# Patient Record
Sex: Male | Born: 2000
Health system: Southern US, Community
[De-identification: ages and names within clinical notes are randomized; demographics above are authoritative.]

## PROBLEM LIST (undated history)

## (undated) DIAGNOSIS — S0291XA Unspecified fracture of skull, initial encounter for closed fracture: Secondary | ICD-10-CM

## (undated) DIAGNOSIS — F4010 Social phobia, unspecified: Secondary | ICD-10-CM

## (undated) DIAGNOSIS — J45909 Unspecified asthma, uncomplicated: Secondary | ICD-10-CM

## (undated) DIAGNOSIS — F1210 Cannabis abuse, uncomplicated: Secondary | ICD-10-CM

## (undated) DIAGNOSIS — G4700 Insomnia, unspecified: Secondary | ICD-10-CM

## (undated) HISTORY — PX: BACK SURGERY: SHX140

## (undated) HISTORY — DX: Unspecified asthma, uncomplicated: J45.909

## (undated) HISTORY — PX: OTHER SURGICAL HISTORY: SHX169

---
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
## 2019-04-10 ENCOUNTER — Ambulatory Visit (HOSPITAL_COMMUNITY)
Admission: EM | Admit: 2019-04-10 | Discharge: 2019-04-10 | Disposition: A | Discharge status: Home / Self Care | Payer: Medicaid Other | Attending: Emergency Medicine | Admitting: Emergency Medicine

## 2019-04-10 ENCOUNTER — Encounter (HOSPITAL_COMMUNITY): Payer: Self-pay | Admitting: *Deleted

## 2019-04-10 ENCOUNTER — Other Ambulatory Visit: Payer: Self-pay

## 2019-04-10 DIAGNOSIS — L089 Local infection of the skin and subcutaneous tissue, unspecified: Secondary | ICD-10-CM | POA: Diagnosis not present

## 2019-04-10 DIAGNOSIS — S60522A Blister (nonthermal) of left hand, initial encounter: Secondary | ICD-10-CM | POA: Diagnosis not present

## 2019-04-10 HISTORY — DX: Unspecified fracture of skull, initial encounter for closed fracture: S02.91XA

## 2019-04-10 MED ORDER — HYDROCODONE-ACETAMINOPHEN 5-325 MG PO TABS: 1.0000 | ORAL_TABLET | Freq: Four times a day (QID) | ORAL | 0 refills | Status: AC | PRN

## 2019-04-10 MED ORDER — DOXYCYCLINE HYCLATE 100 MG PO CAPS
100.0000 mg | ORAL_CAPSULE | Freq: Two times a day (BID) | ORAL | 0 refills | Status: AC
Start: 2019-04-10 — End: 2019-04-20

## 2019-04-10 NOTE — Discharge Instructions (Addendum)
Begin doxycycline twice daily for 10 days  Use anti-inflammatories for pain/swelling. You may take up to 800 mg Ibuprofen every 8 hours with food. You may supplement Ibuprofen with Tylenol [phone] every 8 hours.   Hydrcodone for severe pain  Follow up in 48 hours or sooner if worsening or not improving, developing fevers
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
## 2019-06-06 ENCOUNTER — Emergency Department (HOSPITAL_COMMUNITY)
Admission: EM | Admit: 2019-06-06 | Discharge: 2019-06-06 | Disposition: A | Discharge status: Home / Self Care | Payer: Medicaid Other | Attending: Emergency Medicine | Admitting: Emergency Medicine

## 2019-06-06 ENCOUNTER — Emergency Department (HOSPITAL_COMMUNITY): Payer: Medicaid Other

## 2019-06-06 ENCOUNTER — Other Ambulatory Visit: Payer: Self-pay

## 2019-06-06 ENCOUNTER — Encounter (HOSPITAL_COMMUNITY): Payer: Self-pay | Admitting: Emergency Medicine

## 2019-06-06 DIAGNOSIS — R51 Headache: Secondary | ICD-10-CM | POA: Diagnosis not present

## 2019-06-06 DIAGNOSIS — R112 Nausea with vomiting, unspecified: Secondary | ICD-10-CM | POA: Diagnosis not present

## 2019-06-06 DIAGNOSIS — E876 Hypokalemia: Secondary | ICD-10-CM | POA: Diagnosis not present

## 2019-06-06 DIAGNOSIS — F1290 Cannabis use, unspecified, uncomplicated: Secondary | ICD-10-CM | POA: Diagnosis not present

## 2019-06-06 DIAGNOSIS — E860 Dehydration: Secondary | ICD-10-CM | POA: Insufficient documentation

## 2019-06-06 DIAGNOSIS — R55 Syncope and collapse: Secondary | ICD-10-CM

## 2019-06-06 DIAGNOSIS — R569 Unspecified convulsions: Secondary | ICD-10-CM | POA: Insufficient documentation

## 2019-06-06 DIAGNOSIS — Z87891 Personal history of nicotine dependence: Secondary | ICD-10-CM | POA: Insufficient documentation

## 2019-06-06 LAB — RAPID URINE DRUG SCREEN, HOSP PERFORMED
Amphetamines: POSITIVE — AB
Barbiturates: NOT DETECTED
Benzodiazepines: NOT DETECTED
Cocaine: NOT DETECTED
Opiates: NOT DETECTED
Tetrahydrocannabinol: POSITIVE — AB

## 2019-06-06 LAB — COMPREHENSIVE METABOLIC PANEL
ALT: 19 U/L (ref 0–44)
AST: 24 U/L (ref 15–41)
Albumin: 4.3 g/dL (ref 3.5–5.0)
Alkaline Phosphatase: 110 U/L (ref 38–126)
Anion gap: 14 (ref 5–15)
BUN: 9 mg/dL (ref 6–20)
CO2: 23 mmol/L (ref 22–32)
Calcium: 8.5 mg/dL — ABNORMAL LOW (ref 8.9–10.3)
Chloride: 102 mmol/L (ref 98–111)
Creatinine, Ser: 0.98 mg/dL (ref 0.61–1.24)
GFR calc Af Amer: >60 mL/min (ref >60)
GFR calc non Af Amer: >60 mL/min (ref >60)
Glucose, Bld: 114 mg/dL — ABNORMAL HIGH (ref 70–99)
Potassium: 3.1 mmol/L — ABNORMAL LOW (ref 3.5–5.1)
Sodium: 139 mmol/L (ref 135–145)
Total Bilirubin: 0.5 mg/dL (ref 0.3–1.2)
Total Protein: 7.3 g/dL (ref 6.5–8.1)

## 2019-06-06 LAB — CBC WITH DIFFERENTIAL/PLATELET
Abs Immature Granulocytes: 0.06 10*3/uL (ref 0.00–0.07)
Basophils Absolute: 0 10*3/uL (ref 0.0–0.1)
Basophils Relative: 0 %
Eosinophils Absolute: 0.1 10*3/uL (ref 0.0–0.5)
Eosinophils Relative: 0 %
HCT: 44.3 % (ref 39.0–52.0)
Hemoglobin: 15.2 g/dL (ref 13.0–17.0)
Immature Granulocytes: 0 %
Lymphocytes Relative: 13 %
Lymphs Abs: 1.7 10*3/uL (ref 0.7–4.0)
MCH: 31.4 pg (ref 26.0–34.0)
MCHC: 34.3 g/dL (ref 30.0–36.0)
MCV: 91.5 fL (ref 80.0–100.0)
Monocytes Absolute: 1 10*3/uL (ref 0.1–1.0)
Monocytes Relative: 7 %
Neutro Abs: 10.7 10*3/uL — ABNORMAL HIGH (ref 1.7–7.7)
Neutrophils Relative %: 80 %
Platelets: 239 10*3/uL (ref 150–400)
RBC: 4.84 MIL/uL (ref 4.22–5.81)
RDW: 12.3 % (ref 11.5–15.5)
WBC: 13.5 10*3/uL — ABNORMAL HIGH (ref 4.0–10.5)
nRBC: 0 % (ref 0.0–0.2)

## 2019-06-06 LAB — URINALYSIS, ROUTINE W REFLEX MICROSCOPIC
Bilirubin Urine: NEGATIVE
Glucose, UA: NEGATIVE mg/dL
Hgb urine dipstick: NEGATIVE
Ketones, ur: NEGATIVE mg/dL
Leukocytes,Ua: NEGATIVE
Nitrite: NEGATIVE
Protein, ur: 30 mg/dL — AB
Specific Gravity, Urine: 1.016 (ref 1.005–1.030)
pH: 6 (ref 5.0–8.0)

## 2019-06-06 LAB — LIPASE, BLOOD: Lipase: 29 U/L (ref 11–51)

## 2019-06-06 MED ORDER — SODIUM CHLORIDE 0.9 % IV BOLUS
1000.0000 mL | Freq: Once | INTRAVENOUS | Status: AC
  Administered 2019-06-06: 07:00:00 1000 mL via INTRAVENOUS

## 2019-06-06 MED ORDER — ONDANSETRON HCL 4 MG/2ML IJ SOLN: 4.0000 mg | Freq: Once | INTRAMUSCULAR | Status: DC

## 2019-06-06 MED ORDER — POTASSIUM CHLORIDE CRYS ER 20 MEQ PO TBCR
40.0000 meq | EXTENDED_RELEASE_TABLET | Freq: Once | ORAL | Status: AC
  Administered 2019-06-06: 08:00:00 40 meq via ORAL
  Filled 2019-06-06: qty 2

## 2019-06-06 MED ORDER — POTASSIUM CHLORIDE CRYS ER 20 MEQ PO TBCR: 20.0000 meq | EXTENDED_RELEASE_TABLET | Freq: Every day | ORAL | 0 refills | Status: AC

## 2019-06-06 MED ORDER — ONDANSETRON HCL 4 MG/2ML IJ SOLN
INTRAMUSCULAR | Status: AC
  Filled 2019-06-06: qty 2

## 2019-06-06 NOTE — ED Notes (Signed)
Pt states nausea has subsided at this time.

## 2019-06-06 NOTE — ED Triage Notes (Signed)
Per EMS, pt from home, was trying to get out of bed, began to "shake" seizure like activity that lasted 3 minutes.  Family reports he fell and hit his head on hard wood floor.  When EMS arrived he was alert and oriented.  Does complain of a headache and nausea, given zofran in route.  Reports he does smoke "weed" daily and stopped taking xanax two weeks ago.

## 2019-06-06 NOTE — Discharge Instructions (Signed)
Your episode today was most likely an episode of passing out.  However we cannot rule out seizure and so you should follow-up with a neurologist.  If you develop new or severe headache, weakness or numbness in your extremities, trouble speaking, blurry vision, recurrent passing out or shaking, or any other new/concerning symptoms then return to the ER for evaluation.  Otherwise, do not drive until cleared by the neurologist.

## 2019-06-06 NOTE — ED Provider Notes (Signed)
Care transferred to me.  Lab work and CT are benign besides mild hypokalemia.  Will give potassium here and on discharge.  Otherwise, patient feels fine and would like to go.  Most likely this was syncope, but given the possibility of seizure, will refer to neurology.  No driving until cleared by them.  Results for orders placed or performed during the hospital encounter of 06/06/19  CBC with Differential/Platelet  Result Value Ref Range   WBC 13.5 (H) 4.0 - 10.5 K/uL   RBC 4.84 4.22 - 5.81 MIL/uL   Hemoglobin 15.2 13.0 - 17.0 g/dL   HCT 78.4 69.6 - 29.5 %   MCV 91.5 80.0 - 100.0 fL   MCH 31.4 26.0 - 34.0 pg   MCHC 34.3 30.0 - 36.0 g/dL   RDW 28.4 13.2 - 44.0 %   Platelets 239 150 - 400 K/uL   nRBC 0.0 0.0 - 0.2 %   Neutrophils Relative % 80 %   Neutro Abs 10.7 (H) 1.7 - 7.7 K/uL   Lymphocytes Relative 13 %   Lymphs Abs 1.7 0.7 - 4.0 K/uL   Monocytes Relative 7 %   Monocytes Absolute 1.0 0.1 - 1.0 K/uL   Eosinophils Relative 0 %   Eosinophils Absolute 0.1 0.0 - 0.5 K/uL   Basophils Relative 0 %   Basophils Absolute 0.0 0.0 - 0.1 K/uL   Immature Granulocytes 0 %   Abs Immature Granulocytes 0.06 0.00 - 0.07 K/uL  Comprehensive metabolic panel  Result Value Ref Range   Sodium 139 135 - 145 mmol/L   Potassium 3.1 (L) 3.5 - 5.1 mmol/L   Chloride 102 98 - 111 mmol/L   CO2 23 22 - 32 mmol/L   Glucose, Bld 114 (H) 70 - 99 mg/dL   BUN 9 6 - 20 mg/dL   Creatinine, Ser 1.02 0.61 - 1.24 mg/dL   Calcium 8.5 (L) 8.9 - 10.3 mg/dL   Total Protein 7.3 6.5 - 8.1 g/dL   Albumin 4.3 3.5 - 5.0 g/dL   AST 24 15 - 41 U/L   ALT 19 0 - 44 U/L   Alkaline Phosphatase 110 38 - 126 U/L   Total Bilirubin 0.5 0.3 - 1.2 mg/dL   GFR calc non Af Amer >60 >60 mL/min   GFR calc Af Amer >60 >60 mL/min   Anion gap 14 5 - 15  Lipase, blood  Result Value Ref Range   Lipase 29 11 - 51 U/L  Urinalysis, Routine w reflex microscopic  Result Value Ref Range   Color, Urine YELLOW YELLOW   APPearance HAZY (A)  CLEAR   Specific Gravity, Urine 1.016 1.005 - 1.030   pH 6.0 5.0 - 8.0   Glucose, UA NEGATIVE NEGATIVE mg/dL   Hgb urine dipstick NEGATIVE NEGATIVE   Bilirubin Urine NEGATIVE NEGATIVE   Ketones, ur NEGATIVE NEGATIVE mg/dL   Protein, ur 30 (A) NEGATIVE mg/dL   Nitrite NEGATIVE NEGATIVE   Leukocytes,Ua NEGATIVE NEGATIVE   RBC / HPF 0-5 0 - 5 RBC/hpf   WBC, UA 0-5 0 - 5 WBC/hpf   Bacteria, UA FEW (A) NONE SEEN   Squamous Epithelial / LPF 0-5 0 - 5   Mucus PRESENT    Hyaline Casts, UA PRESENT    Ca Oxalate Crys, UA PRESENT    Sperm, UA PRESENT   Rapid urine drug screen (hospital performed)  Result Value Ref Range   Opiates NONE DETECTED NONE DETECTED   Cocaine NONE DETECTED NONE DETECTED   Benzodiazepines  NONE DETECTED NONE DETECTED   Amphetamines POSITIVE (A) NONE DETECTED   Tetrahydrocannabinol POSITIVE (A) NONE DETECTED   Barbiturates NONE DETECTED NONE DETECTED   Ct Head Wo Contrast  Result Date: 06/06/2019 CLINICAL DATA:  Syncopal episode EXAM: CT HEAD WITHOUT CONTRAST TECHNIQUE: Contiguous axial images were obtained from the base of the skull through the vertex without intravenous contrast. COMPARISON:  May 16, 2017 FINDINGS: Brain: No evidence of acute infarction, hemorrhage, hydrocephalus, extra-axial collection or mass lesion/mass effect. Vascular: No hyperdense vessel or unexpected calcification. Skull: Signs of previous skull fracture on the right. No signs of acute fracture or focal lesion. Sinuses/Orbits: No acute finding. Other: None. IMPRESSION: No acute intracranial abnormality. Electronically Signed   By: Zetta Bills M.D.   On: 06/06/2019 08:13      Sherwood Gambler, MD 06/06/19 [phone]

## 2019-06-06 NOTE — ED Notes (Signed)
Patient verbalizes understanding of discharge instructions. Opportunity for questioning and answers were provided. Armband removed by staff, pt discharged from ED.   Patient's father will be transporting home.
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
## 2021-02-26 IMAGING — CT CT HEAD W/O CM
4 series · 15 of 47 positions shown, 17 images · non-contrast
Comparison: May 16, 2017

CLINICAL DATA: Syncopal episode

EXAM:
CT HEAD WITHOUT CONTRAST
TECHNIQUE: Contiguous axial images were obtained from the base of the skull
through the vertex without intravenous contrast.

[Series 3: head without · axial · non-contrast · 0.39mm/px · z∈[-56,+64]mm · 7 of 33 slices shown, 9 images]
[im 5/33  brain]
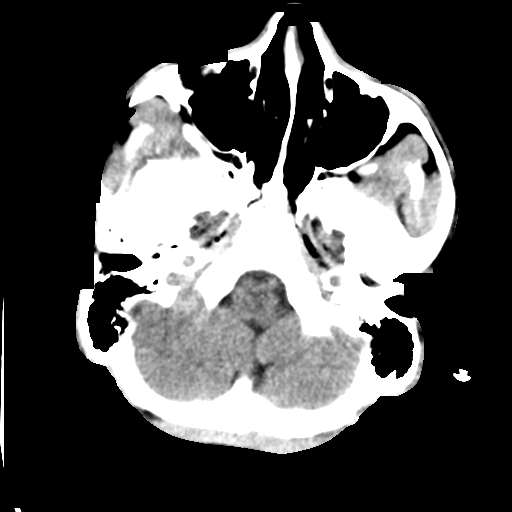
[im 5/33  bone]
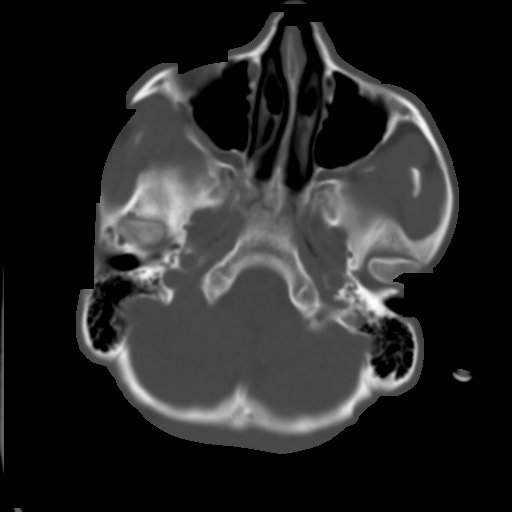
[im 9/33  brain]
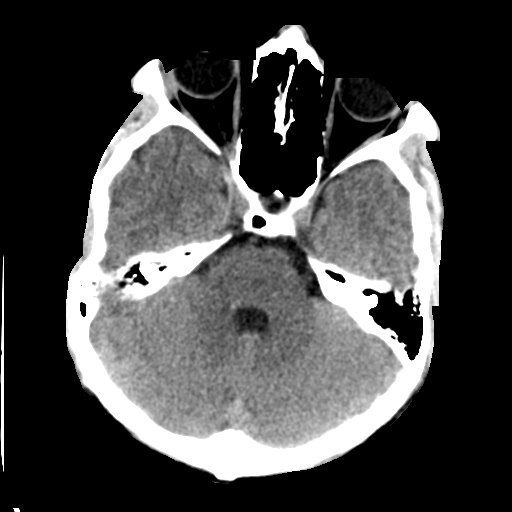
[im 13/33  brain]
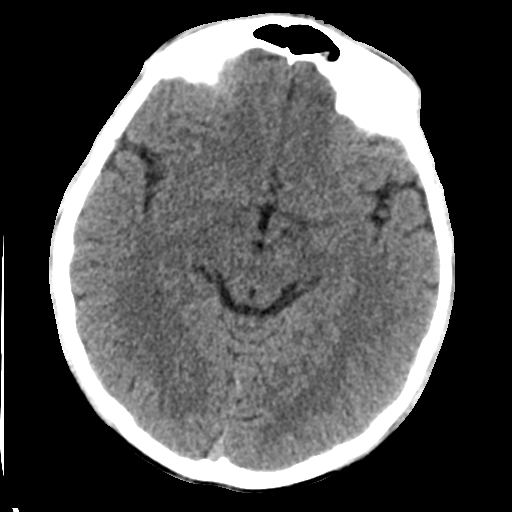
[im 17/33  brain]
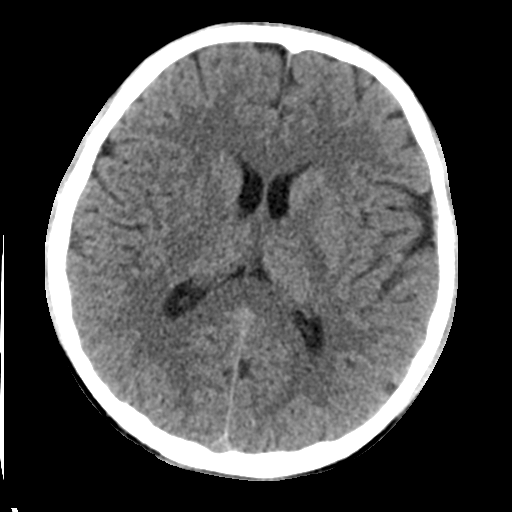
[im 21/33  brain]
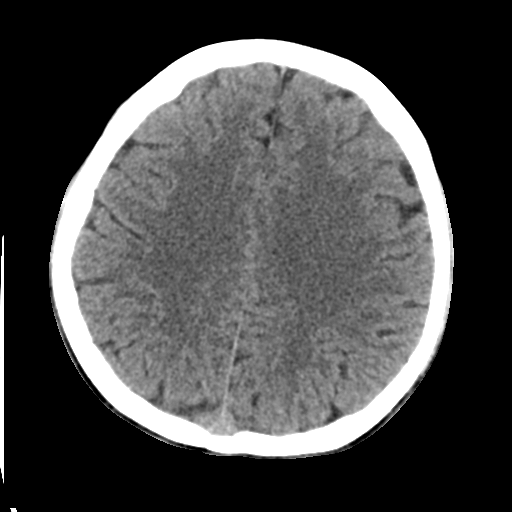
[im 21/33  bone]
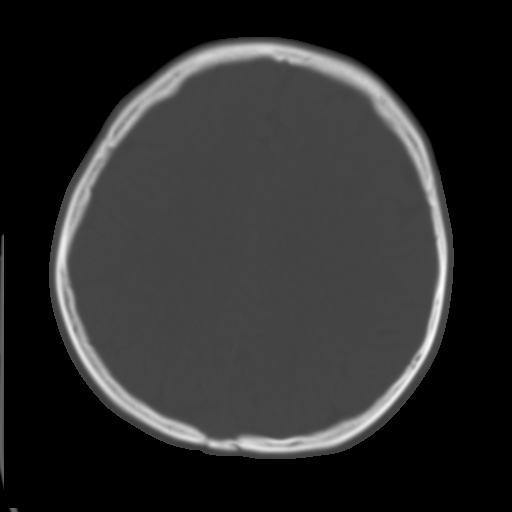
[im 25/33  brain]
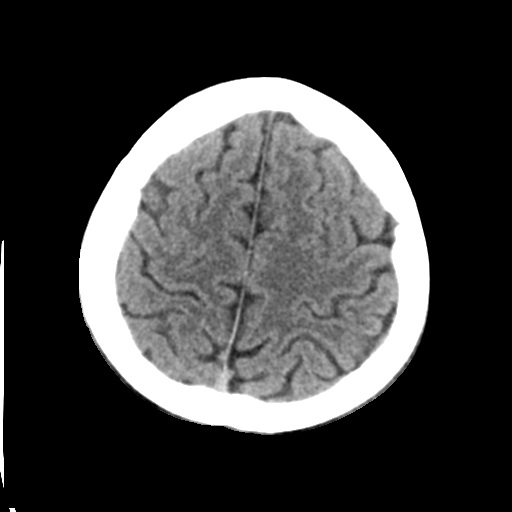
[im 29/33  brain]
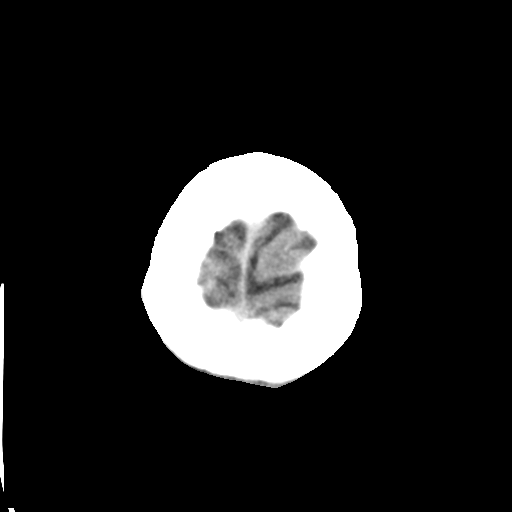

[Series 4: head bone · axial · 0.39mm/px · z∈[-60,-44]mm · 2 of 81 slices shown]
[im 9/81  bone]
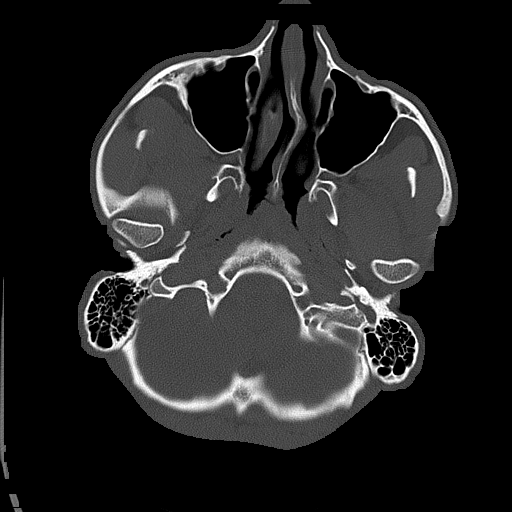
[im 17/81  bone]
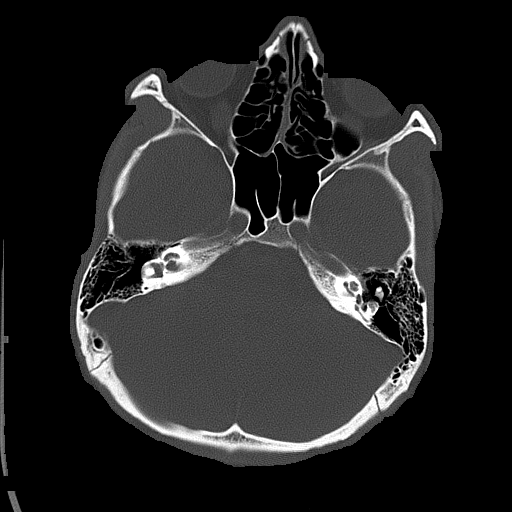

[Series 5: head without cor · coronal · non-contrast · 0.31mm/px · 3 of 70 slices shown]
[im 24/70  brain]
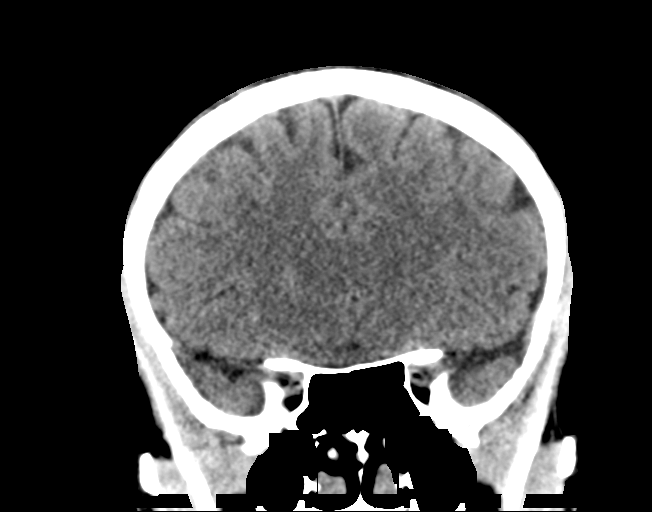
[im 31/70  brain]
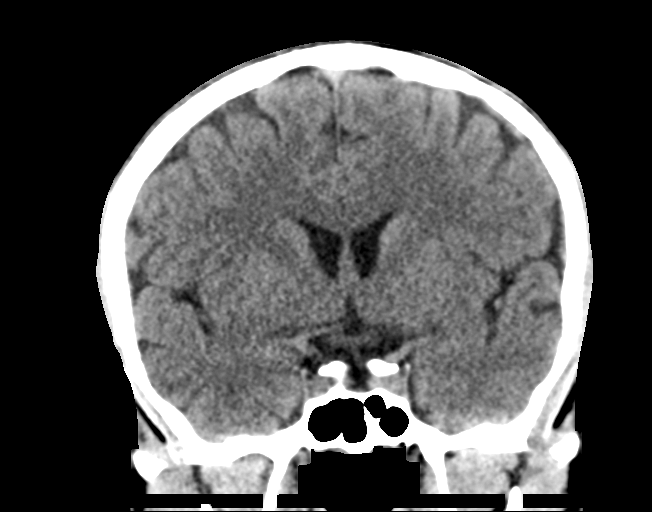
[im 39/70  brain]
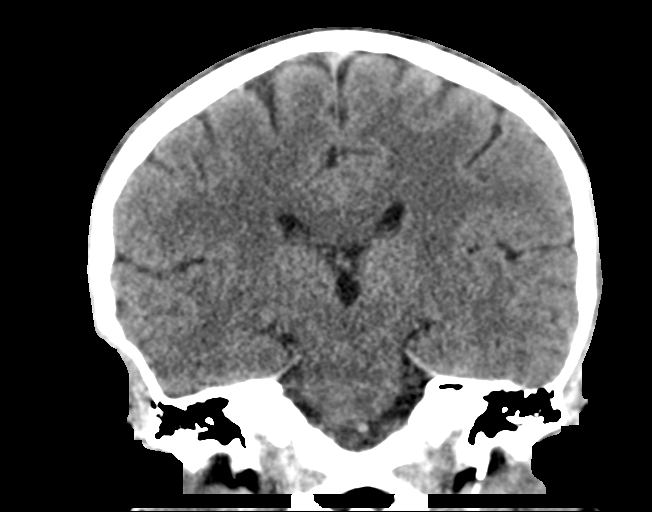

[Series 6: head without sag · sagittal · non-contrast · 0.31mm/px · 3 of 66 slices shown]
[im 22/66  brain]
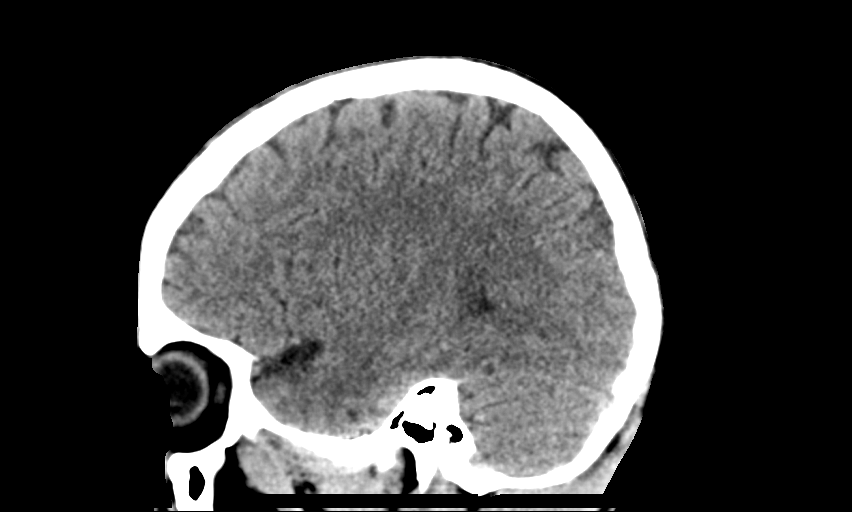
[im 33/66  brain]
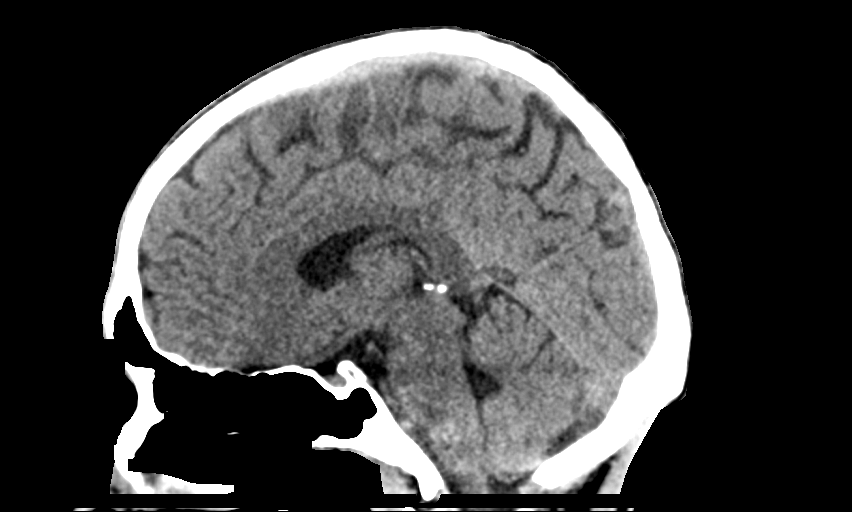
[im 44/66  brain]
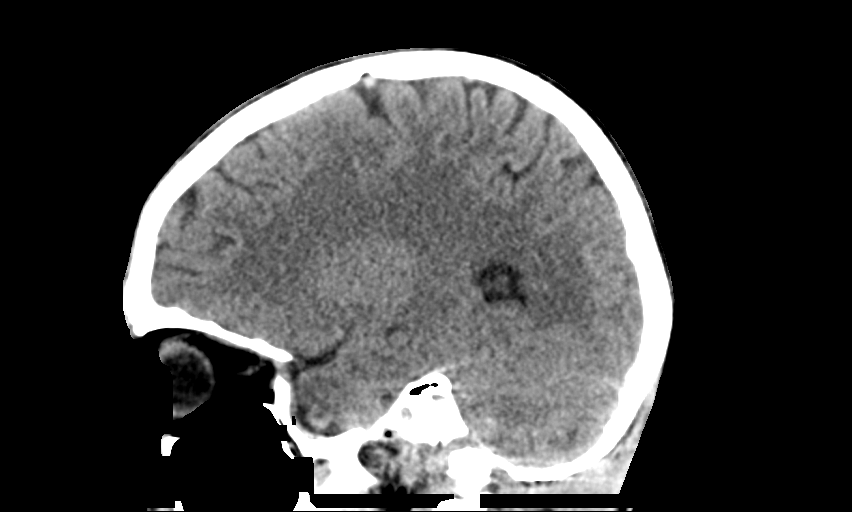

[15 of 47 positions shown; findings below may reference images not displayed]

FINDINGS: Brain: No evidence of acute infarction, hemorrhage, hydrocephalus,
extra-axial collection or mass lesion/mass effect.

Vascular: No hyperdense vessel or unexpected calcification.

Skull: Signs of previous skull fracture on the right. No signs of
acute fracture or focal lesion.

Sinuses/Orbits: No acute finding.

Other: None.
IMPRESSION: No acute intracranial abnormality.

## 2024-07-31 ENCOUNTER — Encounter (HOSPITAL_COMMUNITY): Payer: Self-pay

## 2024-07-31 ENCOUNTER — Other Ambulatory Visit: Payer: Self-pay

## 2024-07-31 ENCOUNTER — Emergency Department (HOSPITAL_COMMUNITY)
Admission: EM | Admit: 2024-07-31 | Discharge: 2024-08-01 | Disposition: A | Discharge status: Home / Self Care | Payer: Worker's Compensation | Attending: Emergency Medicine | Admitting: Emergency Medicine

## 2024-07-31 DIAGNOSIS — S61211A Laceration without foreign body of left index finger without damage to nail, initial encounter: Secondary | ICD-10-CM | POA: Diagnosis present

## 2024-07-31 DIAGNOSIS — W268XXA Contact with other sharp object(s), not elsewhere classified, initial encounter: Secondary | ICD-10-CM | POA: Insufficient documentation

## 2024-07-31 DIAGNOSIS — Z23 Encounter for immunization: Secondary | ICD-10-CM | POA: Diagnosis not present

## 2024-07-31 DIAGNOSIS — J45909 Unspecified asthma, uncomplicated: Secondary | ICD-10-CM | POA: Diagnosis not present

## 2024-07-31 MED ORDER — TETANUS-DIPHTH-ACELL PERTUSSIS 5-2-15.5 LF-MCG/0.5 IM SUSP
0.5000 mL | Freq: Once | INTRAMUSCULAR | Status: AC
  Administered 2024-08-01: 0.5 mL via INTRAMUSCULAR
  Filled 2024-07-31: qty 0.5

## 2024-07-31 MED ORDER — LIDOCAINE HCL (PF) 1 % IJ SOLN
5.0000 mL | Freq: Once | INTRAMUSCULAR | Status: AC
  Administered 2024-08-01: 5 mL
  Filled 2024-07-31: qty 30

## 2024-07-31 NOTE — ED Provider Notes (Signed)
 [place] EMERGENCY DEPARTMENT AT [facility] Provider Note   CSN: [id number] Arrival date & time: 07/31/24  2312     Patient presents with: Laceration   Joshua Proctor is a 23 y.o. male.  {Add pertinent medical, surgical, social history, OB history to HPI:744} 23 year old male presents to the emergency department for evaluation of laceration to the left index finger.  States that he was cleaning a cooler when he cut his finger on a piece of metal.  Bleeding controlled with pressure.  Not on chronic anticoagulation.  Cannot recall the date of his last tetanus shot.   Laceration      Prior to Admission medications   Medication Sig Start Date End Date Taking? Authorizing Provider  HYDROcodone -acetaminophen  (NORCO/VICODIN) 5-325 MG tablet Take 1 tablet by mouth every 6 (six) hours as needed for severe pain. 04/10/19   Wieters, Hallie C, PA-C  potassium chloride  SA (K-DUR) 20 MEQ tablet Take 1 tablet (20 mEq total) by mouth daily. 06/06/19   Freddi Hamilton, MD    Allergies: Penicillins    Review of Systems Ten systems reviewed and are negative for acute change, except as noted in the HPI.    Updated Vital Signs BP 115/75   Pulse 80   Temp 98.4 F (36.9 C) (Oral)   Resp 15   SpO2 98%   Physical Exam Vitals and nursing note reviewed.  Constitutional:      General: He is not in acute distress.    Appearance: He is well-developed. He is not diaphoretic.     Comments: Nontoxic-appearing  HENT:     Head: Normocephalic and atraumatic.  Eyes:     General: No scleral icterus.    Conjunctiva/sclera: Conjunctivae normal.  Cardiovascular:     Rate and Rhythm: Normal rate and regular rhythm.     Pulses: Normal pulses.     Comments: Capillary refill brisk in all digits of the left hand. Pulmonary:     Effort: Pulmonary effort is normal. No respiratory distress.  Musculoskeletal:        General: Normal range of motion.       Hands:     Cervical back: Normal  range of motion.     Comments: Curved laceration to the dorsal aspect of the left index finger.  No palpable, pulsatile bleeding.  No visualized or palpated foreign bodies.  Preserved range of motion of digit.  Skin:    General: Skin is warm and dry.     Coloration: Skin is not pale.     Findings: No erythema or rash.  Neurological:     Mental Status: He is alert and oriented to person, place, and time.  Psychiatric:        Behavior: Behavior normal.     (all labs ordered are listed, but only abnormal results are displayed) Labs Reviewed - No data to display  EKG: None  Radiology: No results found.  {Document cardiac monitor, telemetry assessment procedure when appropriate:32947} Procedures   Medications Ordered in the ED  lidocaine  (PF) (XYLOCAINE ) 1 % injection 5 mL (has no administration in time range)  Tdap (ADACEL) injection 0.5 mL (has no administration in time range)      {Click here for ABCD2, HEART and other calculators REFRESH Note before signing:1}                              Medical Decision Making Risk Prescription drug management.   ***  {  Document critical care time when appropriate  Document review of labs and clinical decision tools ie CHADS2VASC2, etc  Document your independent review of radiology images and any outside records  Document your discussion with family members, caretakers and with consultants  Document social determinants of health affecting pt's care  Document your decision making why or why not admission, treatments were needed:32947:::1}   Final diagnoses:  None    ED Discharge Orders     None

## 2024-07-31 NOTE — ED Triage Notes (Signed)
 Pt states that he was cleaning a cooler and cut his left first finger.

## 2024-08-01 NOTE — Discharge Instructions (Signed)
Avoid soaking your wound in stagnant or dirty water such as while taking a bath. You can shower normally. Keep the area clean with mild soap and warm water. Do not apply peroxide or alcohol to your wound as this can break down newly forming skin and prolong wound healing. If you keep the area bandaged, change the dressing/bandage at least once per day. Have your staples/sutures removed in 12-14 days.

## 2024-08-18 ENCOUNTER — Emergency Department (HOSPITAL_COMMUNITY)
Admission: EM | Admit: 2024-08-18 | Discharge: 2024-08-18 | Disposition: A | Discharge status: Home / Self Care | Payer: Self-pay | Attending: Emergency Medicine | Admitting: Emergency Medicine

## 2024-08-18 DIAGNOSIS — Z4802 Encounter for removal of sutures: Secondary | ICD-10-CM | POA: Insufficient documentation

## 2024-08-18 NOTE — ED Triage Notes (Signed)
 Pt here for suture removal to L finger.

## 2024-08-18 NOTE — ED Provider Notes (Signed)
 [place] EMERGENCY DEPARTMENT AT [facility] Provider Note   CSN: [id number] Arrival date & time: 08/18/24  1340     Patient presents with: Suture / Staple Removal   Joshua Proctor is a 23 y.o. male with overall noncontributory past medical history who presents concern for suture removal from left pointer finger.  Laceration occurred on 11/16.  Patient reports that has been healing appropriately.  No signs of drainage or infection.   HPI     Prior to Admission medications   Medication Sig Start Date End Date Taking? Authorizing Provider  HYDROcodone -acetaminophen  (NORCO/VICODIN) 5-325 MG tablet Take 1 tablet by mouth every 6 (six) hours as needed for severe pain. 04/10/19   Wieters, Hallie C, PA-C  potassium chloride  SA (K-DUR) 20 MEQ tablet Take 1 tablet (20 mEq total) by mouth daily. 06/06/19   Freddi Hamilton, MD    Allergies: Penicillins    Review of Systems  All other systems reviewed and are negative.   Updated Vital Signs BP (!) 109/59 (BP Location: Right Arm)   Pulse 77   Temp 98.4 F (36.9 C) (Oral)   Resp 16   SpO2 99%   Physical Exam Vitals and nursing note reviewed.  Constitutional:      General: He is not in acute distress.    Appearance: Normal appearance.  HENT:     Head: Normocephalic and atraumatic.  Eyes:     General:        Right eye: No discharge.        Left eye: No discharge.  Cardiovascular:     Rate and Rhythm: Normal rate and regular rhythm.  Pulmonary:     Effort: Pulmonary effort is normal. No respiratory distress.  Musculoskeletal:        General: No deformity.  Skin:    General: Skin is warm and dry.     Comments: Appropriately healing laceration on left index finger dorsum  Neurological:     Mental Status: He is alert and oriented to person, place, and time.  Psychiatric:        Mood and Affect: Mood normal.        Behavior: Behavior normal.     (all labs ordered are listed, but only abnormal results are  displayed) Labs Reviewed - No data to display  EKG: None  Radiology: No results found.   Suture Removal  Date/Time: 08/18/2024 2:08 PM  Performed by: Rosan Sherlean DEL, PA-C Authorized by: Rosan Sherlean DEL, PA-C   Consent:    Consent obtained:  Verbal   Consent given by:  Patient   Risks, benefits, and alternatives were discussed: yes     Risks discussed:  Bleeding   Alternatives discussed:  No treatment Universal protocol:    Procedure explained and questions answered to patient or proxy's satisfaction: yes     Patient identity confirmed:  Verbally with patient Location:    Location:  Upper extremity   Upper extremity location:  Hand   Hand location:  L index finger Procedure details:    Wound appearance:  No signs of infection, good wound healing and clean   Number of sutures removed:  5 Post-procedure details:    Post-removal:  No dressing applied   Procedure completion:  Tolerated    Medications Ordered in the ED - No data to display  Medical Decision Making  Well-appearing 23 year old male comes in with laceration, no need for suture removal to left index finger.  Original injury on 11/16.  He reports no difficulty with healing.  Sutures removed as described above without difficulty.  Final diagnoses:  Visit for suture removal    ED Discharge Orders     None          Rosan Sherlean VEAR DEVONNA 08/18/24 1427    Laurice Maude BROCKS, MD 08/19/24 1609

## 2024-08-18 NOTE — Discharge Instructions (Signed)
 Continue to perform hand hygiene, monitor for any signs of developing infection.  Keep the hand covered when you are working until it is fully healed.
